# Patient Record
Sex: Male | Born: 1980 | Race: Black or African American | Hispanic: No | Marital: Single | Smoking: Never smoker
Health system: Southern US, Community
[De-identification: ages and names within clinical notes are randomized; demographics above are authoritative.]

## PROBLEM LIST (undated history)

## (undated) HISTORY — PX: ANKLE FRACTURE SURGERY: SHX122

---
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
## 2000-07-19 ENCOUNTER — Observation Stay (HOSPITAL_COMMUNITY): Admission: EM | Admit: 2000-07-19 | Discharge: 2000-07-19 | Payer: Self-pay

## 2003-10-23 ENCOUNTER — Emergency Department (HOSPITAL_COMMUNITY): Admission: EM | Admit: 2003-10-23 | Discharge: 2003-10-24 | Payer: Self-pay

## 2003-10-25 ENCOUNTER — Emergency Department (HOSPITAL_COMMUNITY): Admission: EM | Admit: 2003-10-25 | Discharge: 2003-10-25 | Payer: Self-pay | Admitting: Emergency Medicine

## 2004-04-25 ENCOUNTER — Emergency Department (HOSPITAL_COMMUNITY): Admission: EM | Admit: 2004-04-25 | Discharge: 2004-04-25 | Payer: Self-pay | Admitting: Emergency Medicine

## 2012-09-27 ENCOUNTER — Emergency Department (HOSPITAL_COMMUNITY)
Admission: EM | Admit: 2012-09-27 | Discharge: 2012-09-28 | Disposition: A | Discharge status: Home / Self Care | Payer: Self-pay | Attending: Emergency Medicine | Admitting: Emergency Medicine

## 2012-09-27 ENCOUNTER — Encounter (HOSPITAL_COMMUNITY): Payer: Self-pay | Admitting: Emergency Medicine

## 2012-09-27 DIAGNOSIS — R0981 Nasal congestion: Secondary | ICD-10-CM

## 2012-09-27 DIAGNOSIS — J3489 Other specified disorders of nose and nasal sinuses: Secondary | ICD-10-CM | POA: Insufficient documentation

## 2012-09-27 LAB — GLUCOSE, CAPILLARY: Glucose-Capillary: 109 mg/dL — ABNORMAL HIGH (ref 70–99)

## 2012-09-27 MED ORDER — PSEUDOEPHEDRINE HCL ER 120 MG PO TB12
120.0000 mg | ORAL_TABLET | Freq: Two times a day (BID) | ORAL | Status: DC
  Administered 2012-09-28: 120 mg via ORAL
  Filled 2012-09-27: qty 1

## 2012-09-27 NOTE — ED Provider Notes (Signed)
History     CSN: [id number]  Arrival date & time 09/27/12  2103   First MD Initiated Contact with Patient 09/27/12 2205      Chief Complaint  Patient presents with  . Nasal Congestion    (Consider location/radiation/quality/duration/timing/severity/associated sxs/prior treatment) HPI Comments: Bilateral frontal nasal congestion without headache Yesterday took tylenol without relief He also states that he has been drinking more fluids than normal and is concerned he may be dehydrated as he still feels thirsty Denies family or personal Hx of diabetes   The history is provided by the patient.    History reviewed. No pertinent past medical history.  Past Surgical History  Procedure Date  . Ankle fracture surgery     History reviewed. No pertinent family history.  History  Substance Use Topics  . Smoking status: Never Smoker   . Smokeless tobacco: Not on file  . Alcohol Use: No      Review of Systems  Constitutional: Negative for fever and chills.  HENT: Positive for congestion. Negative for sore throat and rhinorrhea.   Eyes: Negative for pain.  Respiratory: Negative for cough and shortness of breath.   Gastrointestinal: Negative for nausea.  Skin: Negative for wound.  Neurological: Negative for dizziness, weakness and headaches.    Allergies  Review of patient's allergies indicates no known allergies.  Home Medications  No current outpatient prescriptions on file.  BP 143/84  Pulse 86  Temp 99.1 F (37.3 C) (Oral)  Resp 18  SpO2 97%  Physical Exam  Constitutional: He is oriented to person, place, and time. He appears well-developed and well-nourished.  HENT:  Head: Normocephalic.  Right Ear: External ear normal.  Left Ear: External ear normal.  Mouth/Throat: Oropharynx is clear and moist.  Eyes: Pupils are equal, round, and reactive to light.  Neck: Normal range of motion.  Cardiovascular: Normal rate.   Pulmonary/Chest: Effort normal.   Musculoskeletal: Normal range of motion.  Neurological: He is alert and oriented to person, place, and time.  Skin: Skin is warm.    ED Course  Procedures (including critical care time)  Labs Reviewed - No data to display No results found.   No diagnosis found.    MDM  Will obtain CBG and provide Sudafed   Blood sugar is 109       Arman Filter, NP 09/28/12 [phone]

## 2012-09-27 NOTE — ED Notes (Signed)
Pt states he is having pressure behind both eyes, nasal congestion, intermittent body cramps  Pt states he has had sxs for the past 3 days but it is worse tonight  Pt states he feels like he is dehydrated  Pt states he has been eating and drinking like normal

## 2012-09-28 MED ORDER — PSEUDOEPHEDRINE HCL ER 120 MG PO TB12: 120.0000 mg | ORAL_TABLET | Freq: Two times a day (BID) | ORAL | Status: AC

## 2012-09-29 NOTE — ED Provider Notes (Signed)
History/physical exam/procedure(s) were performed by non-physician practitioner and as supervising physician I was immediately available for consultation/collaboration. I have reviewed all notes and am in agreement with care and plan.   Yezenia Fredrick S Teniyah Seivert, MD 09/29/12 1759 

## 2012-12-20 ENCOUNTER — Institutional Professional Consult (permissible substitution): Payer: Self-pay | Admitting: Pulmonary Disease

## 2014-10-25 ENCOUNTER — Other Ambulatory Visit: Payer: Self-pay

## 2014-10-25 ENCOUNTER — Emergency Department (INDEPENDENT_AMBULATORY_CARE_PROVIDER_SITE_OTHER)
Admission: EM | Admit: 2014-10-25 | Discharge: 2014-10-25 | Disposition: A | Discharge status: Home / Self Care | Payer: Self-pay | Source: Home / Self Care | Attending: Emergency Medicine | Admitting: Emergency Medicine

## 2014-10-25 ENCOUNTER — Encounter (HOSPITAL_COMMUNITY): Payer: Self-pay | Admitting: Emergency Medicine

## 2014-10-25 DIAGNOSIS — J039 Acute tonsillitis, unspecified: Secondary | ICD-10-CM

## 2014-10-25 LAB — POCT RAPID STREP A: Streptococcus, Group A Screen (Direct): NEGATIVE

## 2014-10-25 MED ORDER — CLINDAMYCIN HCL 300 MG PO CAPS: 300.0000 mg | ORAL_CAPSULE | Freq: Three times a day (TID) | ORAL | Status: AC

## 2014-10-25 MED ORDER — TRAMADOL HCL 50 MG PO TABS: 50.0000 mg | ORAL_TABLET | Freq: Four times a day (QID) | ORAL | Status: AC | PRN

## 2014-10-25 NOTE — ED Notes (Signed)
Sore throat for 2 days, right side worse than left side of throat.  Tonsil, red, swollen, white patches.

## 2014-10-25 NOTE — Discharge Instructions (Signed)

## 2014-10-25 NOTE — ED Provider Notes (Signed)
CSN: [id number]     Arrival date & time 10/25/14  1044 History   First MD Initiated Contact with Patient 10/25/14 1203     Chief Complaint  Patient presents with  . Sore Throat   (Consider location/radiation/quality/duration/timing/severity/associated sxs/prior Treatment) HPI Comments: Daughter ill with same Reports himself to be otherwise healthy PCP: none Works as Veterinary surgeonrealtor nonsmoker  Patient is a 34 y.o. male presenting with pharyngitis. The history is provided by the patient.  Sore Throat This is a new problem. The current episode started 2 days ago. The problem occurs constantly. The problem has been gradually worsening.    History reviewed. No pertinent past medical history. Past Surgical History  Procedure Laterality Date  . Ankle fracture surgery     No family history on file. History  Substance Use Topics  . Smoking status: Never Smoker   . Smokeless tobacco: Not on file  . Alcohol Use: No    Review of Systems  All other systems reviewed and are negative.   Allergies  Review of patient's allergies indicates no known allergies.  Home Medications   Prior to Admission medications   Medication Sig Start Date End Date Taking? Authorizing Provider  ibuprofen (ADVIL,MOTRIN) 200 MG tablet Take 200 mg by mouth every 6 (six) hours as needed.   Yes Historical Provider, MD  clindamycin (CLEOCIN) 300 MG capsule Take 1 capsule (300 mg total) by mouth 3 (three) times daily. X 7days 10/25/14   Mathis FareJennifer Lee H Presson, PA  pseudoephedrine (SUDAFED) 120 MG 12 hr tablet Take 1 tablet (120 mg total) by mouth 2 (two) times daily. Patient not taking: Reported on 10/25/2014 09/28/12   Arman FilterGail K Schulz, NP  traMADol (ULTRAM) 50 MG tablet Take 1 tablet (50 mg total) by mouth every 6 (six) hours as needed for moderate pain or severe pain. 10/25/14   Jess BartersJennifer Lee H Presson, PA   BP 126/85 mmHg  Pulse 88  Temp(Src) 99.2 F (37.3 C) (Oral)  Resp 12  SpO2 97% Physical Exam  Constitutional:  He is oriented to person, place, and time. He appears well-developed and well-nourished. No distress.  HENT:  Head: Normocephalic and atraumatic.  Right Ear: Hearing, tympanic membrane, external ear and ear canal normal.  Left Ear: Hearing, tympanic membrane, external ear and ear canal normal.  Nose: Nose normal.  Mouth/Throat: Uvula is midline and mucous membranes are normal.    No clinical indication of peritonsillar abscess  Neck:  Tender right anterior cervical lymphadenopathy  Cardiovascular: Normal rate, regular rhythm and normal heart sounds.   Pulmonary/Chest: Effort normal and breath sounds normal.  Musculoskeletal: Normal range of motion.  Neurological: He is alert and oriented to person, place, and time.  Skin: Skin is warm and dry.  Psychiatric: He has a normal mood and affect. His behavior is normal.  Nursing note and vitals reviewed.   ED Course  Procedures (including critical care time) Labs Review Labs Reviewed  POCT RAPID STREP A ([facility] CARE ONLY)    Imaging Review No results found.   MDM   1. Tonsillitis with exudate   Rapid strep negative Swab sent for culture Salt water gargles Tramadol for pain Clindamycin for right sided exudative tonsillitis caused by atypical organisms with follow up if no improvement.    Ria ClockJennifer Lee H Presson, GeorgiaPA 10/25/14 [phone]

## 2014-10-28 LAB — CULTURE, GROUP A STREP: Strep A Culture: POSITIVE — AB

## 2014-10-31 ENCOUNTER — Telehealth (HOSPITAL_COMMUNITY): Payer: Self-pay | Admitting: *Deleted

## 2014-10-31 NOTE — ED Notes (Signed)
Throat culture: Group A Strep.  I called pt. Pt. verified x 2 and given result.  Pt. told he was adequately treated with Cleocin and to finish all of antibiotic. If not better to get rechecked.  He should notify anyone he exposed that he had strep, if they get the same symptoms.  Pt. had not questions. Vassie MoselleYork, Etola Mull M 10/31/2014

## 2021-09-20 ENCOUNTER — Other Ambulatory Visit: Payer: Self-pay

## 2021-09-20 ENCOUNTER — Encounter (HOSPITAL_COMMUNITY): Payer: Self-pay

## 2021-09-20 ENCOUNTER — Emergency Department (HOSPITAL_COMMUNITY)
Admission: EM | Admit: 2021-09-20 | Discharge: 2021-09-20 | Disposition: A | Discharge status: Home / Self Care | Payer: 59 | Attending: Emergency Medicine | Admitting: Emergency Medicine

## 2021-09-20 ENCOUNTER — Emergency Department (HOSPITAL_COMMUNITY): Payer: 59

## 2021-09-20 DIAGNOSIS — R519 Headache, unspecified: Secondary | ICD-10-CM | POA: Diagnosis present

## 2021-09-20 LAB — CBC WITH DIFFERENTIAL/PLATELET
Abs Immature Granulocytes: 0.02 10*3/uL (ref 0.00–0.07)
Basophils Absolute: 0.1 10*3/uL (ref 0.0–0.1)
Basophils Relative: 1 %
Eosinophils Absolute: 0.2 10*3/uL (ref 0.0–0.5)
Eosinophils Relative: 2 %
HCT: 46.9 % (ref 39.0–52.0)
Hemoglobin: 15.1 g/dL (ref 13.0–17.0)
Immature Granulocytes: 0 %
Lymphocytes Relative: 34 %
Lymphs Abs: 3.1 10*3/uL (ref 0.7–4.0)
MCH: 27.6 pg (ref 26.0–34.0)
MCHC: 32.2 g/dL (ref 30.0–36.0)
MCV: 85.6 fL (ref 80.0–100.0)
Monocytes Absolute: 0.6 10*3/uL (ref 0.1–1.0)
Monocytes Relative: 7 %
Neutro Abs: 5.2 10*3/uL (ref 1.7–7.7)
Neutrophils Relative %: 56 %
Platelets: 248 10*3/uL (ref 150–400)
RBC: 5.48 MIL/uL (ref 4.22–5.81)
RDW: 14.5 % (ref 11.5–15.5)
WBC: 9.1 10*3/uL (ref 4.0–10.5)
nRBC: 0 % (ref 0.0–0.2)

## 2021-09-20 LAB — BASIC METABOLIC PANEL
Anion gap: 5 (ref 5–15)
BUN: 16 mg/dL (ref 6–20)
CO2: 28 mmol/L (ref 22–32)
Calcium: 9.3 mg/dL (ref 8.9–10.3)
Chloride: 103 mmol/L (ref 98–111)
Creatinine, Ser: 0.94 mg/dL (ref 0.61–1.24)
GFR, Estimated: >60 mL/min (ref >60)
Glucose, Bld: 97 mg/dL (ref 70–99)
Potassium: 4.2 mmol/L (ref 3.5–5.1)
Sodium: 136 mmol/L (ref 135–145)

## 2021-09-20 NOTE — ED Provider Notes (Signed)
[facility]-EMERGENCY DEPT Provider Note   CSN: [id number] Arrival date & time: 09/20/21  1552     History  Chief Complaint  Patient presents with   Headache    Malik Stewart is a 41 y.o. male.  Patient complaining of a pressure-like sensation in the back of his head the left side intermittently for the past week.  He states when he first started he noticed a pressure there.  The next day he had episodes of headache that would come and go but a continued pressure in that region.  Today he had another episode of pain which lasted for several hours and has resolved.  Currently states that he just has a sensation of pressure with no pain.  He has intermittent episodes of blurry vision as well but no blurry vision at this time.  No new numbness or weakness reported.  No fever no cough no vomiting or diarrhea no neck pain no chest pain abdominal pain.      Home Medications Prior to Admission medications   Medication Sig Start Date End Date Taking? Authorizing Provider  clindamycin (CLEOCIN) 300 MG capsule Take 1 capsule (300 mg total) by mouth 3 (three) times daily. X 7days Patient not taking: Reported on 09/20/2021 10/25/14   Ria Clock, PA  pseudoephedrine (SUDAFED) 120 MG 12 hr tablet Take 1 tablet (120 mg total) by mouth 2 (two) times daily. Patient not taking: Reported on 09/20/2021 09/28/12   Earley Favor, NP  traMADol (ULTRAM) 50 MG tablet Take 1 tablet (50 mg total) by mouth every 6 (six) hours as needed for moderate pain or severe pain. Patient not taking: Reported on 09/20/2021 10/25/14   Ria Clock, PA      Allergies    Patient has no known allergies.    Review of Systems   Review of Systems  Constitutional:  Negative for fever.  HENT:  Negative for ear pain and sore throat.   Eyes:  Negative for pain.  Respiratory:  Negative for cough.   Cardiovascular:  Negative for chest pain.  Gastrointestinal:  Negative for abdominal pain.   Genitourinary:  Negative for flank pain.  Musculoskeletal:  Negative for back pain.  Skin:  Negative for color change and rash.  Neurological:  Positive for headaches. Negative for syncope.  All other systems reviewed and are negative.  Physical Exam Updated Vital Signs BP 130/80    Pulse 70    Temp 98.9 F (37.2 C) (Oral)    Resp 16    Ht 5\' 10"  (1.778 m)    Wt 123.4 kg    SpO2 100%    BMI 39.03 kg/m  Physical Exam Constitutional:      Appearance: He is well-developed.  HENT:     Head: Normocephalic.     Nose: Nose normal.  Eyes:     Extraocular Movements: Extraocular movements intact.  Cardiovascular:     Rate and Rhythm: Normal rate.  Pulmonary:     Effort: Pulmonary effort is normal.  Abdominal:     Tenderness: There is no abdominal tenderness. There is no guarding or rebound.  Skin:    Coloration: Skin is not jaundiced.  Neurological:     General: No focal deficit present.     Mental Status: He is alert and oriented to person, place, and time. Mental status is at baseline.     Cranial Nerves: No cranial nerve deficit.     Motor: No weakness.  Gait: Gait normal.    ED Results / Procedures / Treatments   Labs (all labs ordered are listed, but only abnormal results are displayed) Labs Reviewed  CBC WITH DIFFERENTIAL/PLATELET  BASIC METABOLIC PANEL    EKG None  Radiology CT Head Wo Contrast  Result Date: 09/20/2021 CLINICAL DATA:  Headache, new or worsening, neuro deficit (Age 80-49y) EXAM: CT HEAD WITHOUT CONTRAST TECHNIQUE: Contiguous axial images were obtained from the base of the skull through the vertex without intravenous contrast. RADIATION DOSE REDUCTION: This exam was performed according to the departmental dose-optimization program which includes automated exposure control, adjustment of the mA and/or kV according to patient size and/or use of iterative reconstruction technique. COMPARISON:  None. FINDINGS: Brain: Fall No acute intracranial  abnormality. Specifically, no hemorrhage, hydrocephalus, mass lesion, acute infarction, or significant intracranial injury. Vascular: No hyperdense vessel or unexpected calcification. Skull: No acute calvarial abnormality. Sinuses/Orbits: Mucosal thickening in the paranasal sinuses. No air-fluid level. Other: None IMPRESSION: No acute intracranial abnormality. Electronically Signed   By: Charlett Nose M.D.   On: 09/20/2021 18:08    Procedures Procedures    Medications Ordered in ED Medications - No data to display  ED Course/ Medical Decision Making/ A&P                           Medical Decision Making  Patient denies any pain or discomfort at this time.  He is otherwise neurovascularly intact.  Lab work and imaging is unremarkable no acute findings noted.  Differential diagnosis included subarachnoid hemorrhage, meningitis, versus other.  However I doubt these given history of gradual onset intermittent symptoms and no symptoms at this time with no focal neurodeficit.  Recommending outpatient follow-up again with his doctor or neurologist within the week.  Advising immediate return for worsening or persistent pain fevers or any additional concerns.        Final Clinical Impression(s) / ED Diagnoses Final diagnoses:  Nonintractable headache, unspecified chronicity pattern, unspecified headache type    Rx / DC Orders ED Discharge Orders     None         Cheryll Cockayne, MD 09/20/21 2133

## 2021-09-20 NOTE — ED Triage Notes (Signed)
Pt reports severe head pressure over the past few days that becomes worse after eating. He endorses some blurred vision when pain is severe. Denies N/V.

## 2021-09-20 NOTE — Discharge Instructions (Addendum)
Call your primary care doctor or specialist as discussed in the next 2-3 days.   Return immediately back to the ER if:  Your symptoms worsen within the next 12-24 hours. You develop new symptoms such as new fevers, persistent vomiting, new pain, shortness of breath, or new weakness or numbness, or if you have any other concerns.  

## 2021-09-20 NOTE — ED Provider Triage Note (Signed)
Emergency Medicine Provider Triage Evaluation Note  Malik Stewart , a 41 y.o. male  was evaluated in triage.  Pt complains of headache.  He states that same has been ongoing for the last week and is worsening.  He states that it is worse in the morning and feels like pressure that starts in the occipital region of his head.  He endorses associated blurred vision when the pain is worse.  He also states that it is worse after he eats and worse when he gets hot.  States that also for the last 2 months he has had what he describes as 'eye flickering' that comes on without discernible trigger and can even last all day.  Denies fever, chills, nausea, vomiting, diarrhea.  Review of Systems  Positive:  Negative: See above  Physical Exam  BP (!) 148/88 (BP Location: Left Arm)    Pulse 65    Temp 98.8 F (37.1 C) (Oral)    Resp 18    Ht 5\' 10"  (1.778 m)    Wt 123.4 kg    SpO2 97%    BMI 39.03 kg/m  Gen:   Awake, no distress   Resp:  Normal effort  MSK:   Moves extremities without difficulty  Other:  Overall neurologically intact  Medical Decision Making  Medically screening exam initiated at 4:39 PM.  Appropriate orders placed.  was informed that the remainder of the evaluation will be completed by another provider, this initial triage assessment does not replace that evaluation, and the importance of remaining in the ED until their evaluation is complete.     Festus Holts, PA-C 09/20/21 1641

## 2023-02-15 IMAGING — CT CT HEAD W/O CM
3 series · 15 of 47 positions shown, 18 images · non-contrast
Comparison: None.

CLINICAL DATA: Headache, new or worsening, neuro deficit (Age
18-49y)



[Series 2: head wo · axial · 0.47mm/px · z∈[-126,+9]mm · 9 of 33 slices shown, 12 images]
[im 3/33  brain]
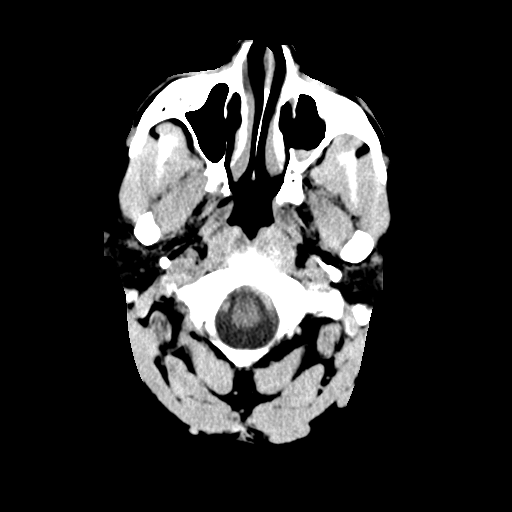
[im 3/33  bone]
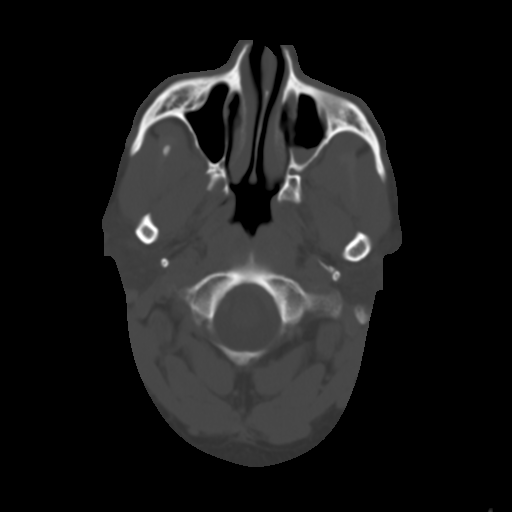
[im 6/33  brain]
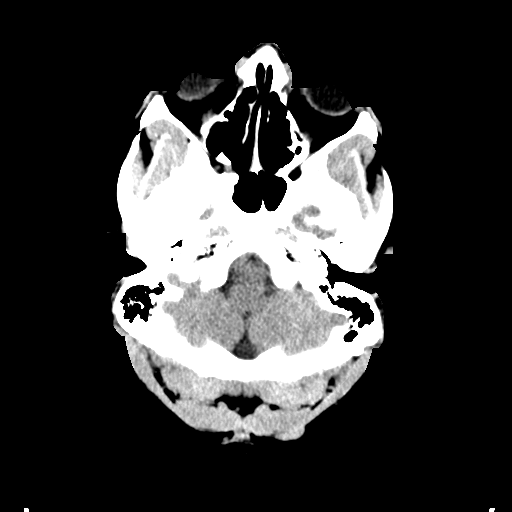
[im 9/33  brain]
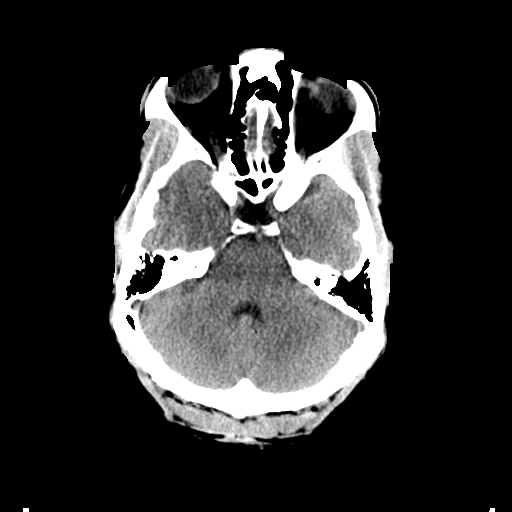
[im 13/33  brain]
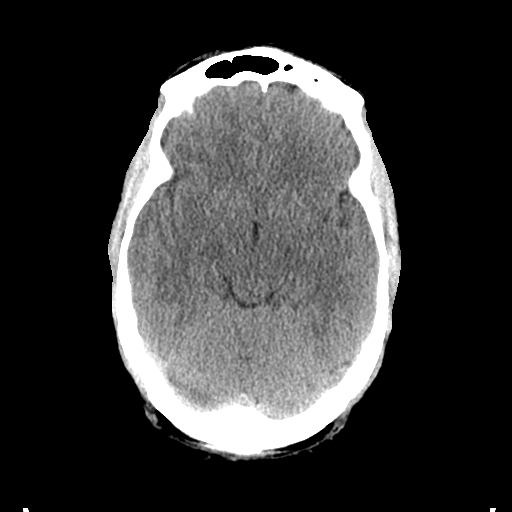
[im 17/33  brain]
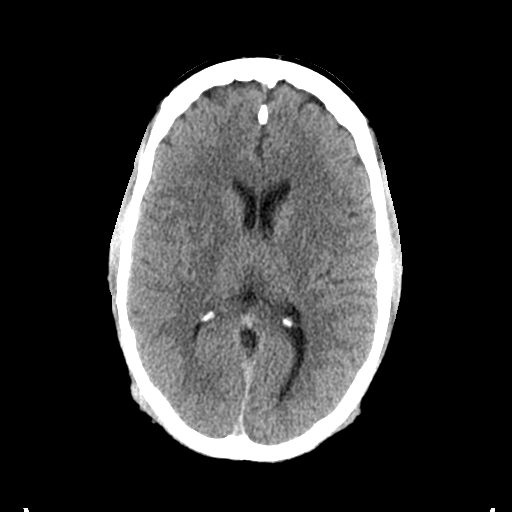
[im 17/33  bone]
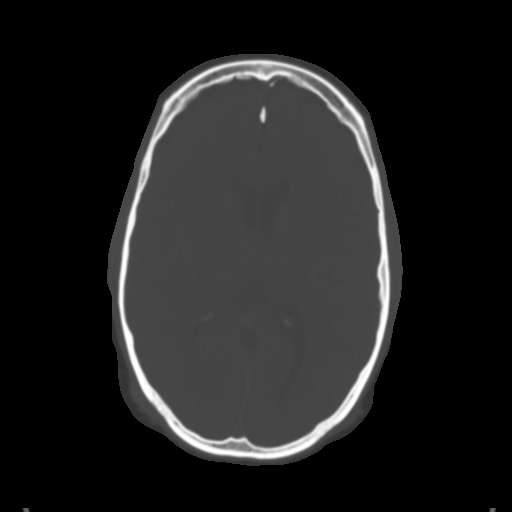
[im 20/33  brain]
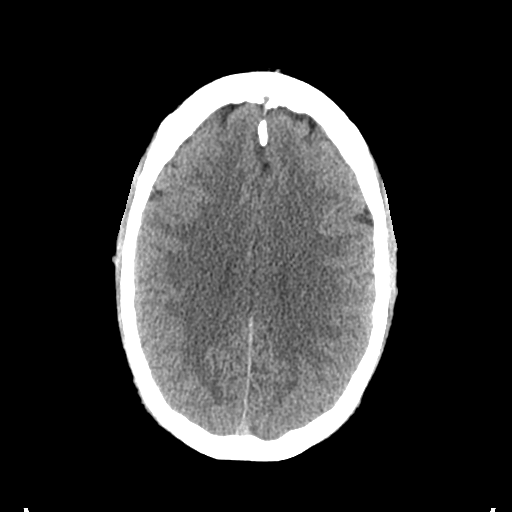
[im 24/33  brain]
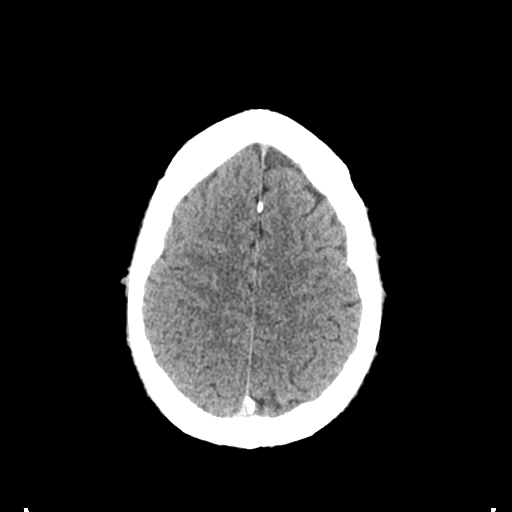
[im 27/33  brain]
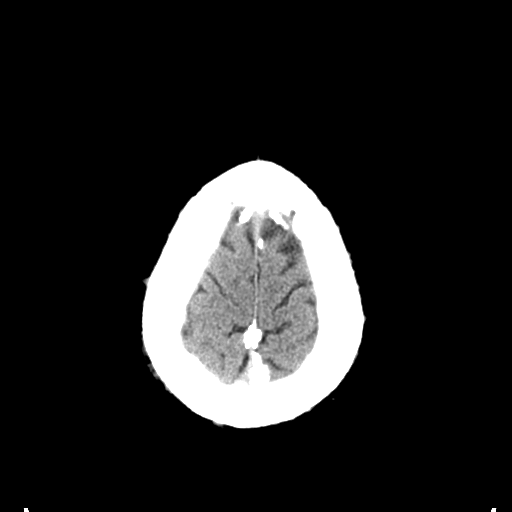
[im 30/33  brain]
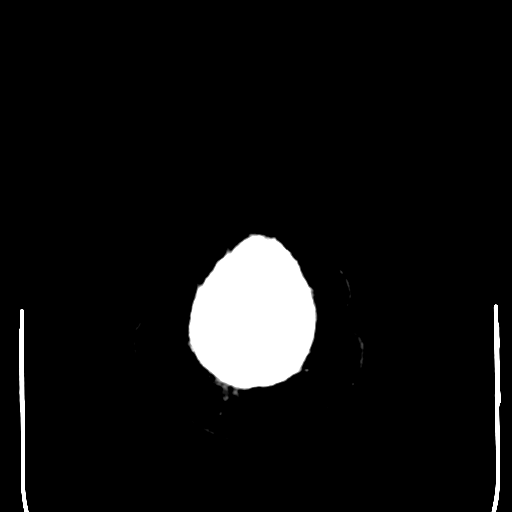
[im 30/33  bone]
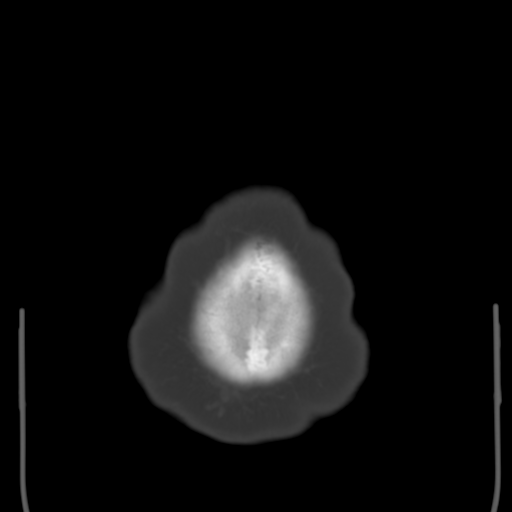

[Series 5: coronal soft tissue · coronal · 0.34mm/px · 3 of 79 slices shown]
[im 27/79  brain]
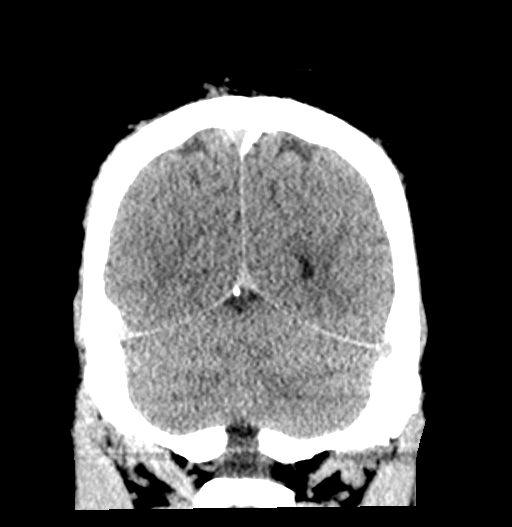
[im 35/79  brain]
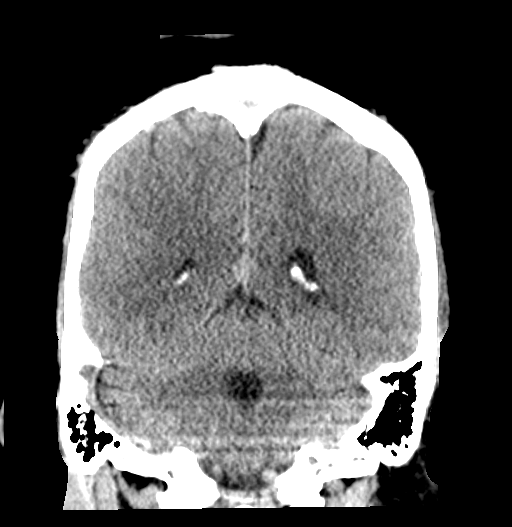
[im 44/79  brain]
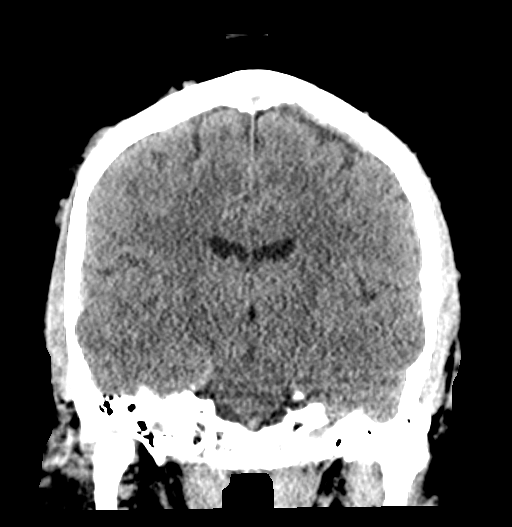

[Series 6: sagittal soft tissue · sagittal · 0.35mm/px · 3 of 59 slices shown]
[im 20/59  brain]
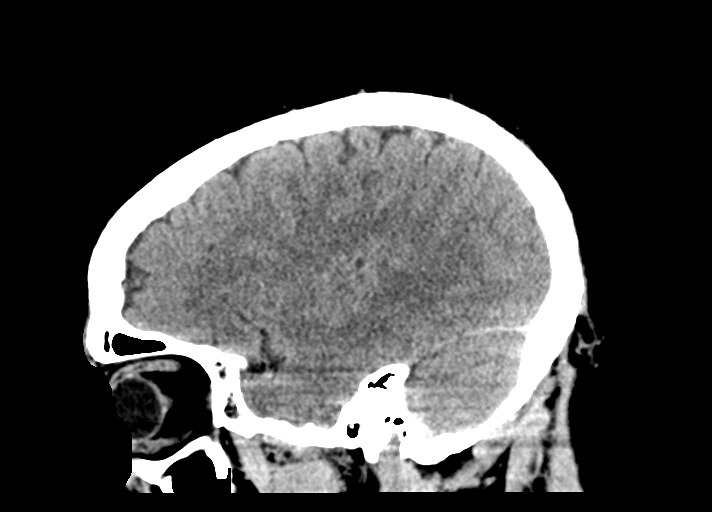
[im 30/59  brain]
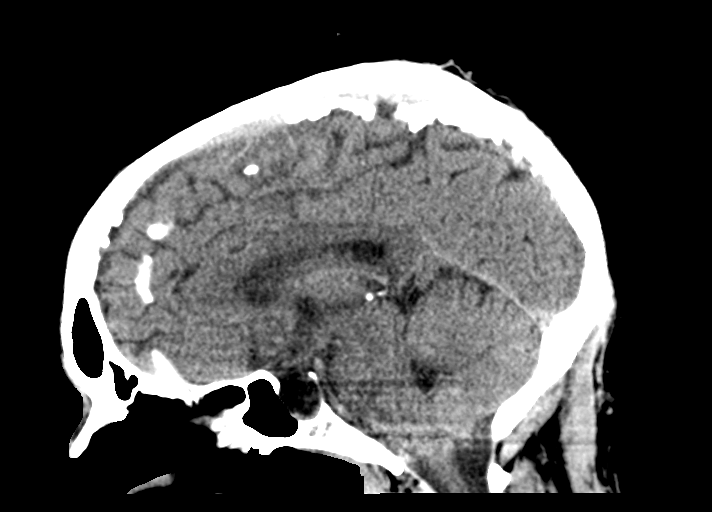
[im 39/59  brain]
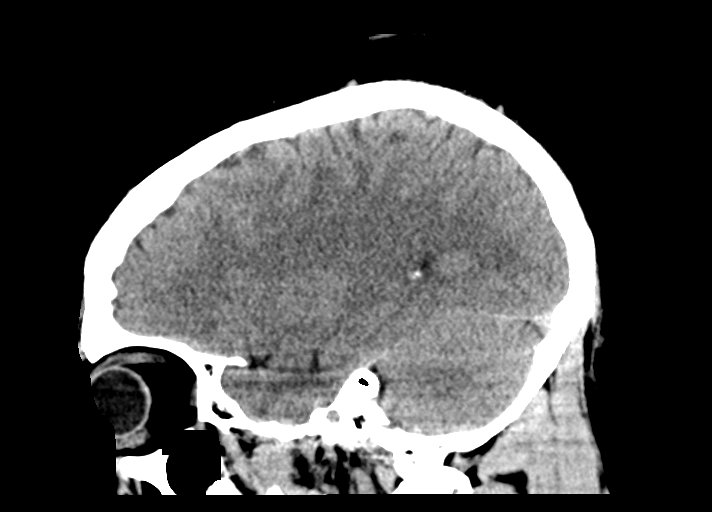

[15 of 47 positions shown; findings below may reference images not displayed]

FINDINGS: Brain: Fall No acute intracranial abnormality. Specifically, no
hemorrhage, hydrocephalus, mass lesion, acute infarction, or
significant intracranial injury.

Vascular: No hyperdense vessel or unexpected calcification.

Skull: No acute calvarial abnormality.

Sinuses/Orbits: Mucosal thickening in the paranasal sinuses. No
air-fluid level.

Other: None
IMPRESSION: No acute intracranial abnormality.

## 2024-04-02 ENCOUNTER — Ambulatory Visit (INDEPENDENT_AMBULATORY_CARE_PROVIDER_SITE_OTHER): Admitting: Orthopaedic Surgery

## 2024-04-02 ENCOUNTER — Other Ambulatory Visit: Payer: Self-pay

## 2024-04-02 ENCOUNTER — Other Ambulatory Visit (INDEPENDENT_AMBULATORY_CARE_PROVIDER_SITE_OTHER)

## 2024-04-02 DIAGNOSIS — G8929 Other chronic pain: Secondary | ICD-10-CM | POA: Diagnosis not present

## 2024-04-02 DIAGNOSIS — M25512 Pain in left shoulder: Secondary | ICD-10-CM

## 2024-04-02 DIAGNOSIS — M25511 Pain in right shoulder: Secondary | ICD-10-CM

## 2024-04-02 NOTE — Progress Notes (Signed)
 Office Visit Note   Patient: Malik Stewart           Date of Birth: Feb 25, 1981           MRN: [id number] Visit Date: 04/02/2024              Requested by: Ilah Crigler, MD [address] [place],  [place] [address] PCP: Ilah Crigler, MD   Assessment & Plan: Visit Diagnoses:  1. Chronic pain of both shoulders     Plan: Patient is a 43 year old gentleman with bilateral shoulder pain worse on the left.  He is concerned that there is a structural abnormality especially in the left shoulder.  Will obtain MRI of both shoulders.  Will reach out to him regarding further steps after the MRI.  Follow-Up Instructions: No follow-ups on file.   Orders:  Orders Placed This Encounter  Procedures   XR Shoulder Left   XR Shoulder Right   No orders of the defined types were placed in this encounter.     Procedures: No procedures performed   Clinical Data: No additional findings.   Subjective: Chief Complaint  Patient presents with   Left Shoulder - Pain   Right Shoulder - Pain    HPI Patient is a 43 year old gentleman here for bilateral shoulder pain worse on the left.  States that he may have had an injury some years ago while weightlifting.  Felt a tearing sensation in the left shoulder.  Has a constant ache in the left shoulder that is painful at times with associated decreased strength.  Has sleep disturbances at times.  Denies any numbness and tingling. Review of Systems  Constitutional: Negative.   HENT: Negative.    Eyes: Negative.   Respiratory: Negative.    Cardiovascular: Negative.   Gastrointestinal: Negative.   Endocrine: Negative.   Genitourinary: Negative.   Skin: Negative.   Allergic/Immunologic: Negative.   Neurological: Negative.   Hematological: Negative.   Psychiatric/Behavioral: Negative.    All other systems reviewed and are negative.    Objective: Vital Signs: There were no vitals taken for this visit.  Physical Exam Vitals and nursing note  reviewed.  Constitutional:      Appearance: He is well-developed.  HENT:     Head: Normocephalic and atraumatic.  Eyes:     Pupils: Pupils are equal, round, and reactive to light.  Pulmonary:     Effort: Pulmonary effort is normal.  Abdominal:     Palpations: Abdomen is soft.  Musculoskeletal:        General: Normal range of motion.     Cervical back: Neck supple.  Skin:    General: Skin is warm.  Neurological:     Mental Status: He is alert and oriented to person, place, and time.  Psychiatric:        Behavior: Behavior normal.        Thought Content: Thought content normal.        Judgment: Judgment normal.     Ortho Exam Examination of bilateral shoulders show full active and passive range of motion.  Excellent rotator cuff strength manual muscle testing.  No impingement signs.  Negative Spurling's. Specialty Comments:  No specialty comments available.  Imaging: XR Shoulder Right Result Date: 04/02/2024 3 view xrays show no acute or structural abnormalities.  Arthritic changes of the [facility] joint.  XR Shoulder Left Result Date: 04/02/2024 X-rays of the left shoulder show no acute or structural abnormalities     PMFS History: There  are no active problems to display for this patient.  No past medical history on file.  No family history on file.  Past Surgical History:  Procedure Laterality Date   ANKLE FRACTURE SURGERY     Social History   Occupational History   Not on file  Tobacco Use   Smoking status: Never   Smokeless tobacco: Not on file  Substance and Sexual Activity   Alcohol use: No   Drug use: No   Sexual activity: Not on file

## 2024-04-16 ENCOUNTER — Other Ambulatory Visit

## 2024-04-30 ENCOUNTER — Other Ambulatory Visit

## 2024-05-21 ENCOUNTER — Encounter: Payer: Self-pay | Admitting: Orthopaedic Surgery

## 2024-05-28 ENCOUNTER — Ambulatory Visit
Admission: RE | Admit: 2024-05-28 | Discharge: 2024-05-28 | Disposition: A | Discharge status: Home / Self Care | Source: Ambulatory Visit | Attending: Orthopaedic Surgery | Admitting: Orthopaedic Surgery

## 2024-05-28 DIAGNOSIS — G8929 Other chronic pain: Secondary | ICD-10-CM

## 2024-07-01 ENCOUNTER — Encounter: Payer: Self-pay | Admitting: Radiology
